# Patient Record
Sex: Male | Born: 1940 | Race: Black or African American | Hispanic: No | Marital: Married
Health system: Southern US, Community
[De-identification: ages and names within clinical notes are randomized; demographics above are authoritative.]

---

## 2005-04-12 ENCOUNTER — Emergency Department: Payer: Self-pay | Admitting: Emergency Medicine

## 2006-01-25 ENCOUNTER — Ambulatory Visit: Payer: Self-pay | Admitting: Gastroenterology

## 2006-08-03 ENCOUNTER — Other Ambulatory Visit: Payer: Self-pay

## 2006-08-03 ENCOUNTER — Inpatient Hospital Stay: Payer: Self-pay | Admitting: Internal Medicine

## 2006-08-07 ENCOUNTER — Other Ambulatory Visit: Payer: Self-pay

## 2006-09-06 ENCOUNTER — Ambulatory Visit: Payer: Self-pay | Admitting: Gastroenterology

## 2006-09-12 ENCOUNTER — Ambulatory Visit: Payer: Self-pay | Admitting: Gastroenterology

## 2007-05-16 ENCOUNTER — Other Ambulatory Visit: Payer: Self-pay

## 2007-05-17 ENCOUNTER — Inpatient Hospital Stay: Payer: Self-pay | Admitting: Psychiatry

## 2007-09-05 ENCOUNTER — Ambulatory Visit: Payer: Self-pay | Admitting: Specialist

## 2008-05-16 ENCOUNTER — Emergency Department: Payer: Self-pay | Admitting: Unknown Physician Specialty

## 2008-09-14 ENCOUNTER — Emergency Department: Payer: Self-pay | Admitting: Unknown Physician Specialty

## 2010-01-05 ENCOUNTER — Inpatient Hospital Stay: Payer: Self-pay | Admitting: Internal Medicine

## 2010-04-28 ENCOUNTER — Ambulatory Visit: Payer: Self-pay | Admitting: Gastroenterology

## 2010-06-02 ENCOUNTER — Ambulatory Visit: Payer: Self-pay | Admitting: Gastroenterology

## 2010-07-14 ENCOUNTER — Ambulatory Visit: Payer: Self-pay | Admitting: Specialist

## 2010-08-26 ENCOUNTER — Ambulatory Visit: Payer: Self-pay | Admitting: Oncology

## 2010-08-27 ENCOUNTER — Ambulatory Visit: Payer: Self-pay

## 2010-08-27 ENCOUNTER — Ambulatory Visit: Payer: Self-pay | Admitting: Oncology

## 2010-08-30 ENCOUNTER — Telehealth: Payer: Self-pay

## 2010-08-30 DIAGNOSIS — R933 Abnormal findings on diagnostic imaging of other parts of digestive tract: Secondary | ICD-10-CM

## 2010-08-30 NOTE — Telephone Encounter (Signed)
Pt scheduled for EUS on 09/16/10 [place] need to review meds and instruct pt.

## 2010-08-30 NOTE — Telephone Encounter (Signed)
I spoke with the wife of the pt and she is aware of the appt and meds were reviewed.  Instructions mailed to the home and pt to call with any questions

## 2010-09-14 ENCOUNTER — Ambulatory Visit: Payer: Self-pay | Admitting: Oncology

## 2010-09-16 ENCOUNTER — Ambulatory Visit: Payer: Self-pay

## 2010-09-16 ENCOUNTER — Encounter: Payer: Self-pay | Admitting: Gastroenterology

## 2010-09-21 ENCOUNTER — Telehealth: Payer: Self-pay | Admitting: Gastroenterology

## 2010-09-21 NOTE — Telephone Encounter (Signed)
I spoke with him and his wife today about the biopsy reports showing "inflamed mucosa with intra-mucosal carcinoma" he is already scheduled to see Dr. Doylene Canning later this week. I will forward results to him and also to his primary gastroenterologist Dr. Niel Hummer.  Patty, and you please make sure that [facility] pathology department since this reports to the above doctors.

## 2010-09-21 NOTE — Telephone Encounter (Signed)
Can you call pathology at [facility], check on the path from his procedure last week. thanks

## 2010-09-21 NOTE — Telephone Encounter (Signed)
Path is being faxed now. I will put results on your desk.

## 2010-09-26 ENCOUNTER — Ambulatory Visit: Payer: Self-pay | Admitting: Oncology

## 2010-10-04 ENCOUNTER — Encounter: Payer: Self-pay | Admitting: Gastroenterology

## 2010-10-27 ENCOUNTER — Ambulatory Visit: Payer: Self-pay | Admitting: Oncology

## 2010-11-27 ENCOUNTER — Ambulatory Visit: Payer: Self-pay | Admitting: Oncology

## 2010-12-27 ENCOUNTER — Ambulatory Visit: Payer: Self-pay | Admitting: Oncology

## 2011-01-27 ENCOUNTER — Ambulatory Visit: Payer: Self-pay | Admitting: Oncology

## 2011-02-09 ENCOUNTER — Ambulatory Visit: Payer: Self-pay | Admitting: Oncology

## 2011-02-22 ENCOUNTER — Inpatient Hospital Stay: Payer: Self-pay | Admitting: Psychiatry

## 2011-02-24 DIAGNOSIS — Z79899 Other long term (current) drug therapy: Secondary | ICD-10-CM

## 2011-02-26 ENCOUNTER — Ambulatory Visit: Payer: Self-pay | Admitting: Oncology

## 2011-04-04 ENCOUNTER — Emergency Department: Payer: Self-pay | Admitting: Emergency Medicine

## 2011-04-04 LAB — SALICYLATE LEVEL: Salicylates, Serum: <1.7 mg/dL

## 2011-04-04 LAB — COMPREHENSIVE METABOLIC PANEL
Alkaline Phosphatase: 73 U/L (ref 50–136)
BUN: 8 mg/dL (ref 7–18)
Bilirubin,Total: 0.3 mg/dL (ref 0.2–1.0)
Chloride: 105 mmol/L (ref 98–107)
Co2: 27 mmol/L (ref 21–32)
Creatinine: 0.95 mg/dL (ref 0.60–1.30)
EGFR (African American): >60
Osmolality: 280 (ref 275–301)
Potassium: 3.1 mmol/L — ABNORMAL LOW (ref 3.5–5.1)
SGOT(AST): 22 U/L (ref 15–37)
SGPT (ALT): 15 U/L
Total Protein: 7.9 g/dL (ref 6.4–8.2)

## 2011-04-04 LAB — DRUG SCREEN, URINE
Amphetamines, Ur Screen: NEGATIVE (ref ?–1000)
Barbiturates, Ur Screen: NEGATIVE (ref ?–200)
Benzodiazepine, Ur Scrn: NEGATIVE (ref ?–200)
Cannabinoid 50 Ng, Ur ~~LOC~~: NEGATIVE (ref ?–50)
MDMA (Ecstasy)Ur Screen: NEGATIVE (ref ?–500)
Methadone, Ur Screen: NEGATIVE (ref ?–300)
Opiate, Ur Screen: NEGATIVE (ref ?–300)
Phencyclidine (PCP) Ur S: NEGATIVE (ref ?–25)

## 2011-04-04 LAB — URINALYSIS, COMPLETE
Bacteria: NONE SEEN
Glucose,UR: NEGATIVE mg/dL (ref 0–75)
Leukocyte Esterase: NEGATIVE
Nitrite: NEGATIVE
Ph: 5 (ref 4.5–8.0)
Protein: NEGATIVE
RBC,UR: <1 /HPF (ref 0–5)
Specific Gravity: 1.005 (ref 1.003–1.030)
WBC UR: 1 /HPF (ref 0–5)

## 2011-04-04 LAB — ETHANOL
Ethanol %: 0.061 % (ref 0.000–0.080)
Ethanol: 61 mg/dL

## 2011-04-04 LAB — CBC
Platelet: 229 10*3/uL (ref 150–440)
RBC: 4.67 10*6/uL (ref 4.40–5.90)
RDW: 15.4 % — ABNORMAL HIGH (ref 11.5–14.5)

## 2011-04-05 LAB — POTASSIUM: Potassium: 3.5 mmol/L (ref 3.5–5.1)

## 2011-05-02 ENCOUNTER — Ambulatory Visit: Payer: Self-pay | Admitting: Oncology

## 2011-05-02 LAB — COMPREHENSIVE METABOLIC PANEL
Alkaline Phosphatase: 84 U/L (ref 50–136)
Anion Gap: 7 (ref 7–16)
Calcium, Total: 8.6 mg/dL (ref 8.5–10.1)
Chloride: 105 mmol/L (ref 98–107)
Co2: 30 mmol/L (ref 21–32)
Creatinine: 1.2 mg/dL (ref 0.60–1.30)
EGFR (Non-African Amer.): >60
Osmolality: 283 (ref 275–301)
Potassium: 3.6 mmol/L (ref 3.5–5.1)
SGOT(AST): 17 U/L (ref 15–37)
Sodium: 142 mmol/L (ref 136–145)

## 2011-05-02 LAB — CBC CANCER CENTER
Basophil #: 0 x10 3/mm (ref 0.0–0.1)
Basophil %: 0.2 %
Eosinophil #: 0.1 x10 3/mm (ref 0.0–0.7)
HGB: 14.3 g/dL (ref 13.0–18.0)
Lymphocyte %: 17 %
MCH: 33.1 pg (ref 26.0–34.0)
MCHC: 34.2 g/dL (ref 32.0–36.0)
Monocyte #: 0.8 x10 3/mm — ABNORMAL HIGH (ref 0.0–0.7)
Monocyte %: 10.6 %
Neutrophil #: 5.6 x10 3/mm (ref 1.4–6.5)
Neutrophil %: 71.1 %
Platelet: 243 x10 3/mm (ref 150–440)
RBC: 4.32 10*6/uL — ABNORMAL LOW (ref 4.40–5.90)
RDW: 15.4 % — ABNORMAL HIGH (ref 11.5–14.5)

## 2011-05-27 ENCOUNTER — Ambulatory Visit: Payer: Self-pay | Admitting: Oncology

## 2011-06-27 ENCOUNTER — Ambulatory Visit: Payer: Self-pay | Admitting: Oncology

## 2011-06-27 ENCOUNTER — Ambulatory Visit: Payer: Self-pay | Admitting: Internal Medicine

## 2011-06-27 LAB — CBC
HCT: 42.4 % (ref 40.0–52.0)
HGB: 14.3 g/dL (ref 13.0–18.0)
MCHC: 33.8 g/dL (ref 32.0–36.0)
MCV: 99 fL (ref 80–100)
RBC: 4.27 10*6/uL — ABNORMAL LOW (ref 4.40–5.90)
WBC: 13.2 10*3/uL — ABNORMAL HIGH (ref 3.8–10.6)

## 2011-06-27 LAB — TROPONIN I: Troponin-I: <0.02 ng/mL

## 2011-06-27 LAB — BASIC METABOLIC PANEL
Anion Gap: 13 (ref 7–16)
BUN: 9 mg/dL (ref 7–18)
Calcium, Total: 8.3 mg/dL — ABNORMAL LOW (ref 8.5–10.1)
Creatinine: 0.95 mg/dL (ref 0.60–1.30)
EGFR (Non-African Amer.): >60
Osmolality: 274 (ref 275–301)

## 2011-06-28 ENCOUNTER — Inpatient Hospital Stay: Payer: Self-pay | Admitting: Specialist

## 2011-06-28 LAB — ETHANOL: Ethanol %: 0.028 % (ref 0.000–0.080)

## 2011-06-28 LAB — TROPONIN I
Troponin-I: <0.02 ng/mL
Troponin-I: <0.02 ng/mL

## 2011-06-28 LAB — CK TOTAL AND CKMB (NOT AT ARMC)
CK, Total: 89 U/L (ref 35–232)
CK, Total: 103 U/L (ref 35–232)
CK-MB: 1.3 ng/mL (ref 0.5–3.6)
CK-MB: 1.7 ng/mL (ref 0.5–3.6)

## 2011-06-29 LAB — COMPREHENSIVE METABOLIC PANEL
Albumin: 2.9 g/dL — ABNORMAL LOW (ref 3.4–5.0)
Alkaline Phosphatase: 57 U/L (ref 50–136)
Anion Gap: 9 (ref 7–16)
Calcium, Total: 8.3 mg/dL — ABNORMAL LOW (ref 8.5–10.1)
Chloride: 105 mmol/L (ref 98–107)
Co2: 26 mmol/L (ref 21–32)
Creatinine: 1.01 mg/dL (ref 0.60–1.30)
EGFR (African American): >60
EGFR (Non-African Amer.): >60
Osmolality: 281 (ref 275–301)
SGOT(AST): 15 U/L (ref 15–37)
SGPT (ALT): 10 U/L — ABNORMAL LOW
Sodium: 140 mmol/L (ref 136–145)
Total Protein: 6.6 g/dL (ref 6.4–8.2)

## 2011-06-29 LAB — CBC WITH DIFFERENTIAL/PLATELET
Basophil #: 0 10*3/uL (ref 0.0–0.1)
Basophil %: 0 %
Eosinophil %: 0 %
HCT: 39.9 % — ABNORMAL LOW (ref 40.0–52.0)
Lymphocyte #: 0.6 10*3/uL — ABNORMAL LOW (ref 1.0–3.6)
MCH: 33 pg (ref 26.0–34.0)
MCHC: 32.9 g/dL (ref 32.0–36.0)
MCV: 100 fL (ref 80–100)
Monocyte #: 1.9 10*3/uL — ABNORMAL HIGH (ref 0.0–0.7)
Neutrophil #: 13.9 10*3/uL — ABNORMAL HIGH (ref 1.4–6.5)
Neutrophil %: 84.8 %
Platelet: 206 10*3/uL (ref 150–440)
RDW: 15.1 % — ABNORMAL HIGH (ref 11.5–14.5)
WBC: 16.4 10*3/uL — ABNORMAL HIGH (ref 3.8–10.6)

## 2011-06-29 LAB — CK TOTAL AND CKMB (NOT AT ARMC): CK, Total: 139 U/L (ref 35–232)

## 2011-07-02 LAB — CULTURE, BLOOD (SINGLE)

## 2011-07-04 LAB — CULTURE, BLOOD (SINGLE)

## 2011-07-27 ENCOUNTER — Ambulatory Visit: Payer: Self-pay | Admitting: Oncology

## 2011-07-27 ENCOUNTER — Ambulatory Visit: Payer: Self-pay | Admitting: Internal Medicine

## 2012-04-09 IMAGING — RF DG BARIUM SWALLOW
12 series · 14 of 24 positions shown · non-contrast
Comparison: none

REASON FOR EXAM: dysphagia
COMMENTS:

[Series 1: fluoro_barium 2fps_bw · 0.17mm/px · 1 of 36 frames shown (1 of 12)]
[frame 6/36]
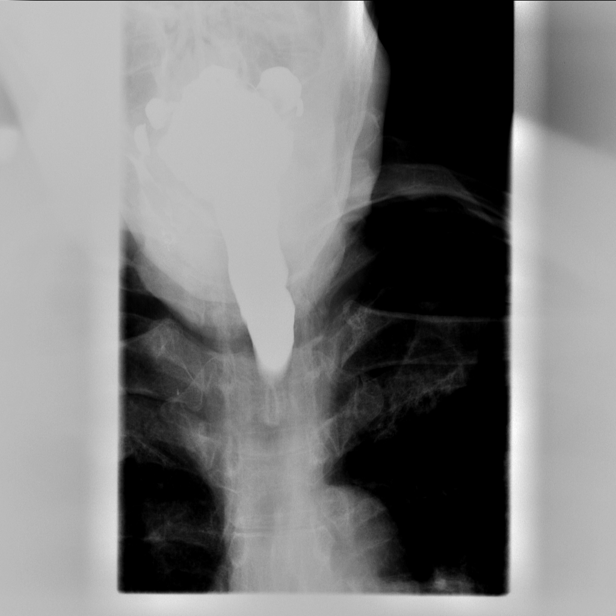

[Series 2: fluoro_barium 2fps_bw · 0.18mm/px · 2 of 40 frames shown (2 of 12)]
[frame 7/40]
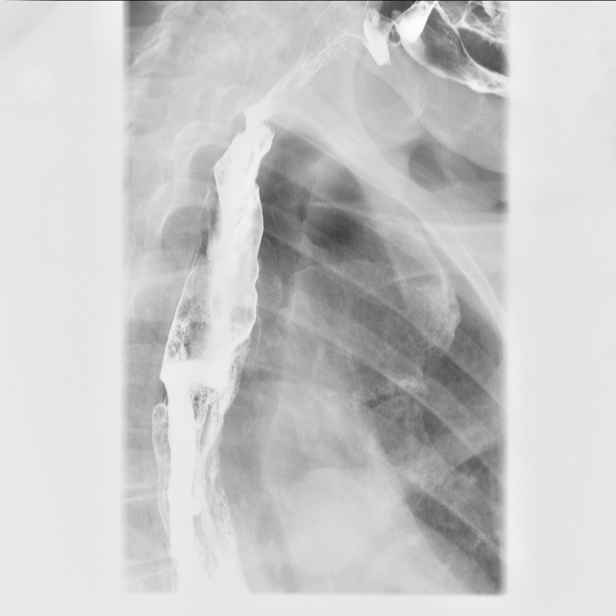
[frame 35/40]
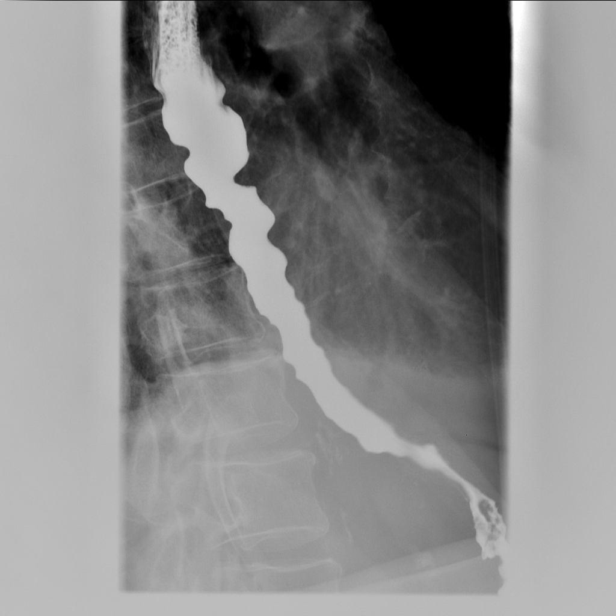

[Series 3: fluoro_barium 2fps_bw · 0.18mm/px · 1 of 32 frames shown (3 of 12)]
[frame 28/32]
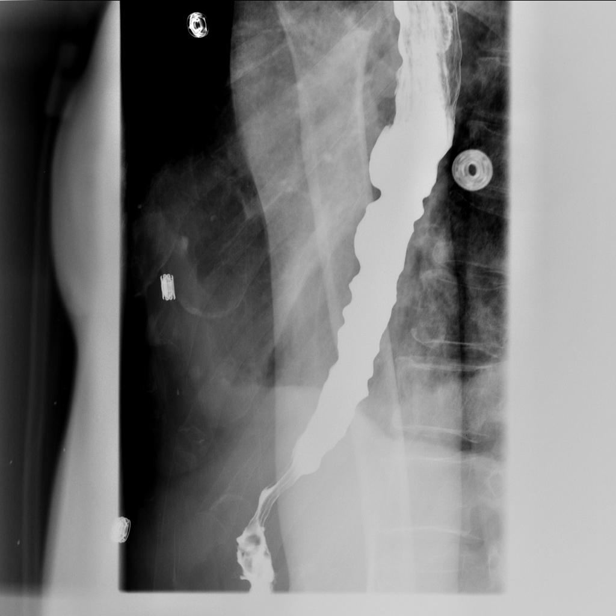

[Series 4: fluoro_barium 2fps_bw · 0.17mm/px · 1 of 28 frames shown (4 of 12)]
[frame 15/28]
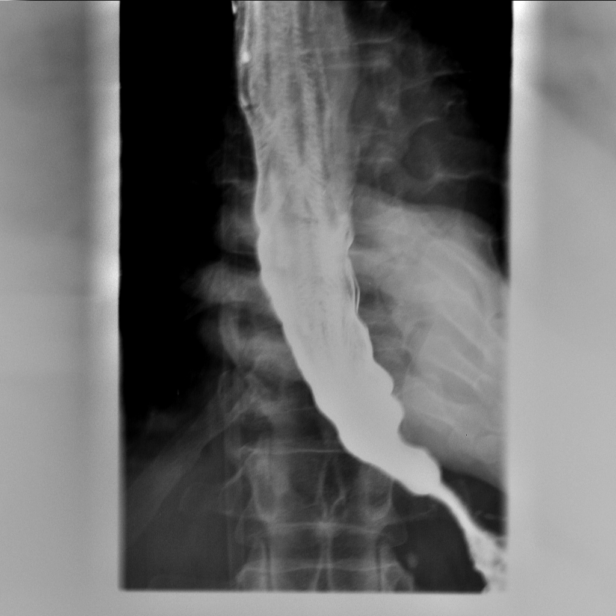

[Series 5: fluoro_barium 2fps_bw · 0.17mm/px · 1 of 23 frames shown (5 of 12)]
[frame 4/23]
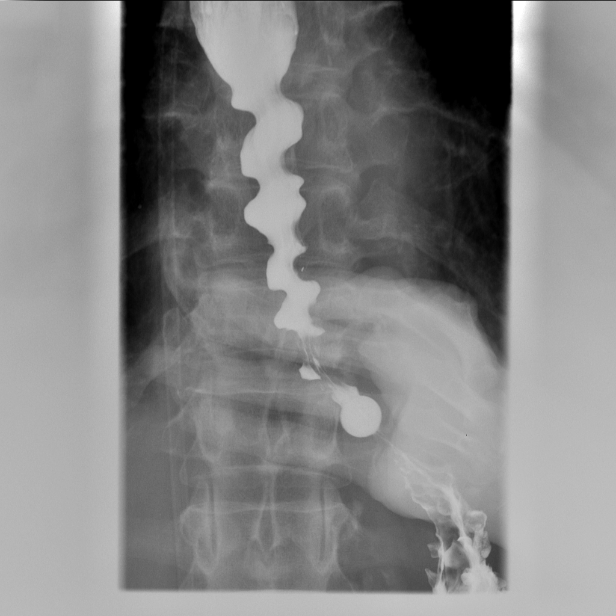

[Series 6: fluoro_barium 2fps_bw · 0.17mm/px · 2 of 22 frames shown (6 of 12)]
[frame 4/22]
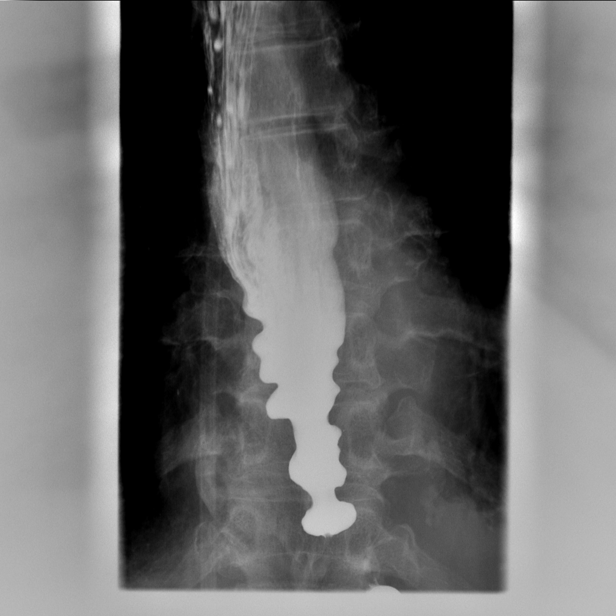
[frame 19/22]
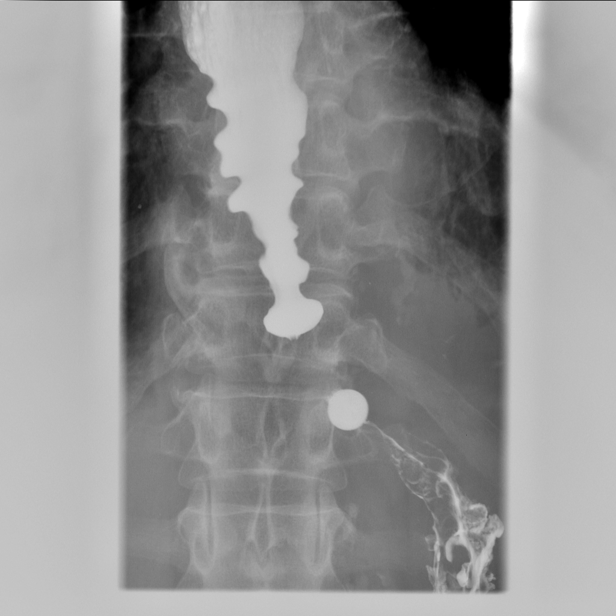

[Series 7: fluoro_barium 2fps_bw · 0.17mm/px · 1 of 25 frames shown (7 of 12)]
[frame 22/25]
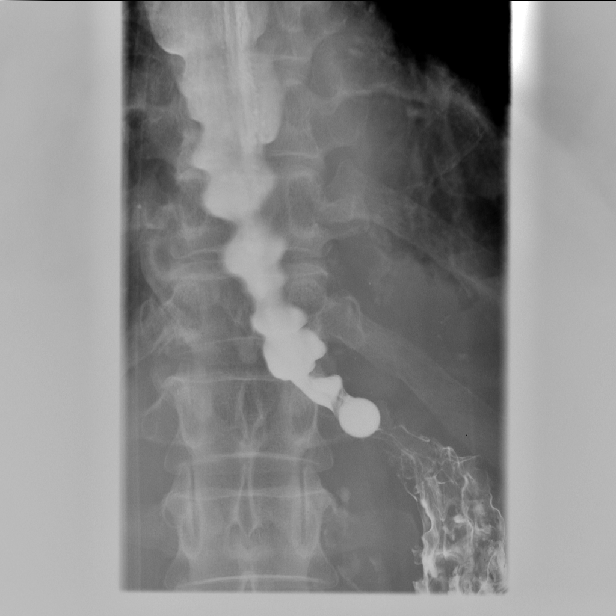

[Series 8: fluoro_barium 2fps_bw · 0.17mm/px · 1 of 15 frames shown (8 of 12)]
[frame 8/15]
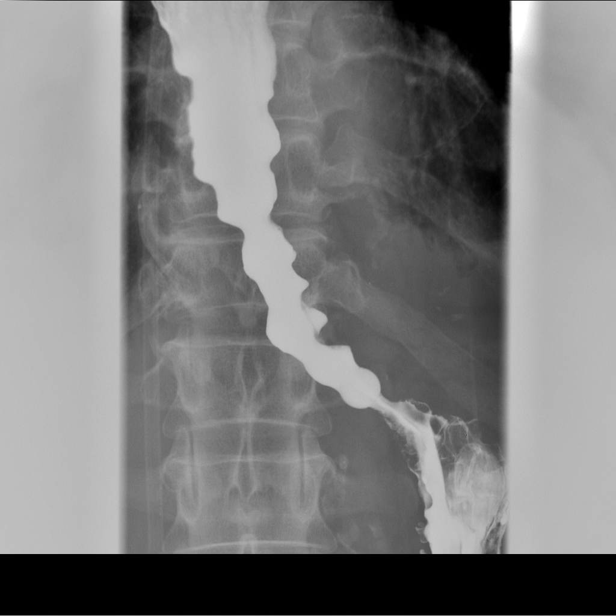

[Series 9: fluoro_barium 2fps_bw · 0.17mm/px · 1 of 40 frames shown (9 of 12)]
[frame 35/40]
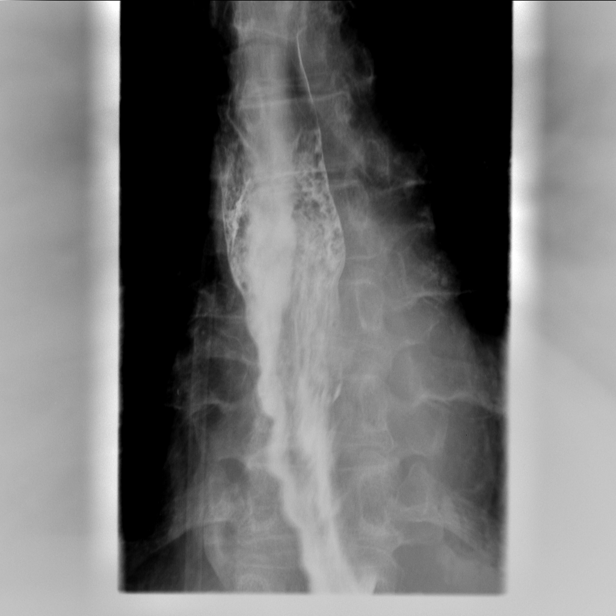

[Series 10: fluoro_barium 2fps_bw · 0.18mm/px · 1 of 2 frames shown (10 of 12)]
[frame 1/2]
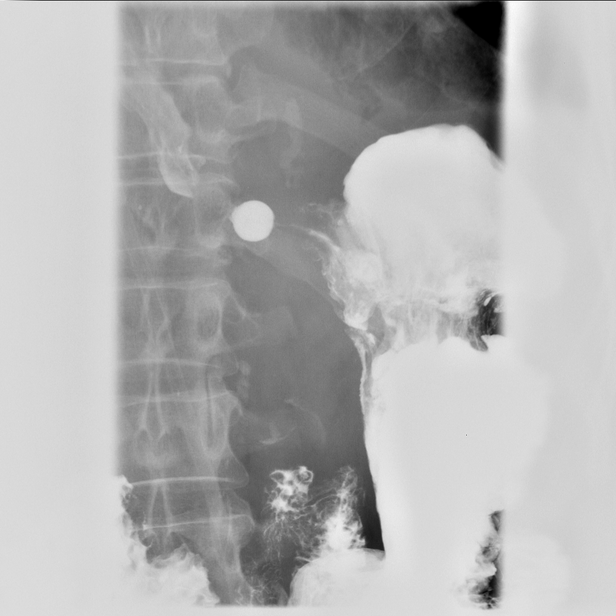

[Series 11: fluoro_barium 2fps_bw · 0.20mm/px · 1 of 4 frames shown (11 of 12)]
[frame 3/4]
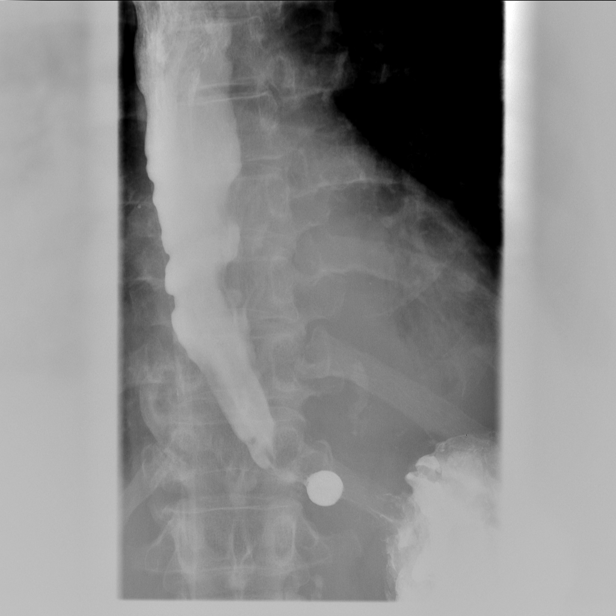

[Series 12: fluoro_barium 2fps_bw · 0.20mm/px · 1 of 5 frames shown (12 of 12)]
[frame 5/5]
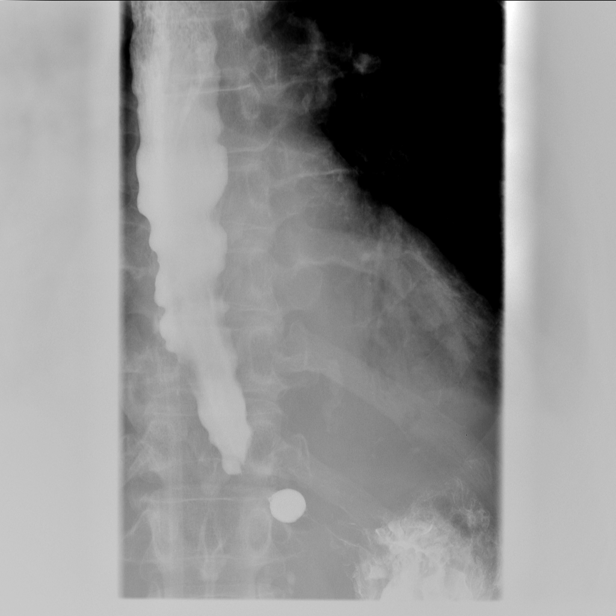

[14 of 24 positions shown; findings below may reference images not displayed]

PROCEDURE:     FL  - FL BARIUM SWALLOW  - July 01, 2011  [DATE]

RESULT:     Barium swallow examination is performed. There is no evidence of
aspiration or penetration of barium into the upper airway. There is
intermittent spasm within the distal esophagus. There is a narrowed area in
the distal esophagus through which a 12.5 mm barium impregnated tablet would
not pass. The tablet did not cause occlusion or obstruction of passage of
liquid through this area. Proximal to this, there is mild dilation within
the midesophagus intermittently. There is very poor peristaltic emptying of
esophageal content.

Within the midesophagus there is mucosal nodularity. Microulcers could not
be excluded. Esophagitis could give this appearance as could opportunistic
infectious etiologies. Endoscopic correlation may be beneficial.
IMPRESSION: No complete obstruction. There is an area of significant narrowing within
the distal esophagus through which liquids passed but which did not allow
passage of a 12.5 mm barium impregnated tablet. There is significant
tertiary spasm within the distal third to half of the esophagus. Proximal to
this, there is dilation with thickening of the mucosa with some nodular
appearance. Some of this could be due to residual ingested material. The
possibility of esophagitis should be considered and correlated clinically.
Endoscopic followup may be beneficial.

## 2014-07-20 NOTE — Discharge Summary (Signed)
PATIENT NAME:  Dillon Morales, Dillon Morales MR#:  [id number] DATE OF BIRTH:  03-Jun-1940  DATE OF ADMISSION:  06/28/2011 DATE OF DISCHARGE:  07/01/2011  For a detailed note, please take a look at the history and physical done on admission by Dr. Deneen Harts.   DISCHARGE DIAGNOSES: 1. Pneumonia, likely suspected aspiration, much improved.  2. Esophageal cancer status post radiation therapy.  3. Dysphagia due to esophageal cancer. 4. Chest pain secondary to esophageal cancer. 5. Psychosis.  6. Alcohol abuse. 7. Tobacco abuse.   DIET: The patient is being discharged on a mechanical soft diet.   ACTIVITY: As tolerated.  REFERRALS:  The patient is being referred for home health psychiatric nursing and also is going to be followed by home hospice.   DISCHARGE FOLLOWUP: Followup with Dr. Johney Maine in the next 1 to 2 weeks.   DISCHARGE MEDICATIONS:  1. Omeprazole 20 mg daily.  2. Risperidone 2 mg daily.  3. Trazodone 100 mg daily.  4. Levaquin 750 mg daily x7 days. 5. Lisinopril 10 mg daily.   CONSULTANTS:  Johney Maine, MD - Oncology.   PERTINENT STUDIES: Chest x-ray done on admission showed shallow inspiration, pulmonary vascular congestion.   Barium swallow done on 07/01/2011 showed no complete obstruction. Area of significant narrowing within the distal esophagus through which liquids passed, but did not allow passage of a 12.5 mm barium impregnated tablet. Significant tertiary spasm within the distal third to half of the esophagus. This could all be related to esophagitis. Endoscopic follow-up may be beneficial.   HOSPITAL COURSE: This is a 74 year old male with medical problems as mentioned above who presented to the hospital on 06/28/2011 secondary to shortness of breath and chest pain suspected from possible pneumonia.  1. Pneumonia: This was likely aspiration pneumonia given his esophageal cancer and dysphagia. The patient was initially empirically started on vancomycin and Zosyn. His antibiotics  were then narrowed to just Levaquin. He has been maintained on Levaquin and done well. He has been afebrile. His blood cultures and sputum cultures have been negative. His blood cultures actually grew out a gram-positive rod, which was a skin contaminant. His respiratory symptoms are significantly improved. Given the fact that he is being discharged to home with hospice services, he is being discharged on some oxygen. As mentioned, the patient did have a barium swallow done which showed no complete obstruction; therefore, he is going to be maintained on a mechanical soft diet for now.  2. Esophageal cancer: The patient is followed by Dr. Doylene Canning. He has had radiation therapy in the past. The patient's prognosis is poor, as he continues to drink and smoke. As mentioned, the barium swallow did not show any complete obstruction; therefore, he will be maintained on a mechanical soft diet. The wife is not interested in any aggressive treatment, but just some comfort care for now. Therefore, there was no Gastroenterology consult placed for endoscopic evaluation. 3. Chest pain: This was most likely chest pain related to esophageal cancer. His cardiac enzymes were cycled and were negative. Currently he is chest pain free. 4. Hypertension: The patient remained hemodynamically stable on some lisinopril. He will be discharged on that.  5. History of alcohol abuse: He was placed on CIWA protocol and did not show any evidence of alcohol withdrawal, and he was strongly advised to abstain from drinking when he goes home.  6. Psychosis: The patient is being maintained on his risperidone, which he will resume upon discharge along with his trazodone. He does have home health  psychiatric service nursing services, which he will continue upon discharge.      CODE STATUS: The patient is a DO NOT INTUBATE, DO NOT RESUSCITATE. He is being discharged home with hospice services. The patient was also seen by Dr. Ned GraceNancy Phifer, from  palliative care, who referred him for hospice services at home.   TIME SPENT: 40 minutes. ____________________________ Rolly PancakeVivek J. Cherlynn KaiserSainani, MD vjs:slb D: 07/01/2011 14:28:57 ET T: 07/02/2011 14:43:53 ET JOB#: [id number]  cc: Rolly PancakeVivek J. Cherlynn KaiserSainani, MD, <Dictator> Gerome SamJanak K. Doylene Canninghoksi, MD Houston SirenVIVEK J SAINANI MD ELECTRONICALLY SIGNED 07/04/2011 14:56

## 2014-07-20 NOTE — Consult Note (Signed)
History of Present Illness:   Reason for Consult History of carcinoma of esophagus had radiation therapy in the past  Failure to thrive  Persistent nausea vomiting  continuing tobacco and alcohol abuse    HPI   June 29, 2011 Patient was admitted in the hospital with progressive declining condition.  Increasing nausea vomiting.  Patient this morning extremely confused and disoriented trying to pull  IV   out. .  Also was found to have aspiration pneumonia.had multiple previous hospitalization.  Multiple biopsies.  Radiation therapy.  Her carcinoma of esophagus.  Also had a hospitalization in psychiatric hospital.  PFSH:   Family History No family history of colorectal cancer, breast cancer, or ovarian cancer.    Social History positive alcohol, positive tobacco    Additional Past Medical and Surgical History As described above   Review of Systems:   General weakness  fatigue    Performance Status (ECOG) 3    HEENT dysphagia    Lungs cough  SOB    Cardiac no complaints    GI nausea/vomiting  pain    GU no complaints    Musculoskeletal back pain    Extremities swelling    Skin no complaints    Neuro Getting confused and disoriented   NURSING NOTES: **Vital Signs.:   03-Apr-13 04:52    Vital Signs Type: Q 4hr    Temperature Temperature (F): 97.6    Celsius: 36.4    Temperature Source: oral    Pulse Pulse: 104    Pulse source: per Dinamap    Respirations Respirations: 22    Systolic BP Systolic BP: 474    Diastolic BP (mmHg) Diastolic BP (mmHg): 80    Mean BP: 100    BP Source: Dinamap    Pulse Ox % Pulse Ox %: 91    Pulse Ox Activity Level: At rest    Oxygen Delivery: 3L   Physical Exam:   General Patient's condition is extremely poor with poor performance status.    HEENT: Poor dental hygiene    Lungs: rales  crepitations    Cardiac: regular rate, rhythm    Abdomen: soft  nontender  positive bowel sounds    Skin: intact     Extremities: edema  1+    Physical Exam Patient is somewhat confused disoriented tremor as no other focal sign     Pneumonia: 28-Jun-2011   Lung cancer with radiation therapy:    asthma:    Cirrhosis:    Alcohol Abuse:    Denies surgical history.:    Denies:    NKDA: None    omeprazole 20 mg delayed release capsule: 1 cap(s) orally once a day , Active, 30, 2   risperidone 2 mg oral tablet: 1 tab(s) orally once a day (at bedtime), Active, 0, None   trazodone 100 mg oral tablet: tab(s) orally once a day (at bedtime), Active, 0, None  Routine Hem:  03-Apr-13 03:47    WBC (CBC) 16.4   RBC (CBC) 3.98   Hemoglobin (CBC) 13.1   Hematocrit (CBC) 39.9   Platelet Count (CBC) 206   MCV 100   MCH 33.0   MCHC 32.9   RDW 15.1  Routine Chem:  03-Apr-13 03:47    Glucose, Serum 101   BUN 16   Creatinine (comp) 1.01   Sodium, Serum 140   Potassium, Serum 3.8   Chloride, Serum 105   CO2, Serum 26   Calcium (Total), Serum 8.3   Anion Gap 9  Osmolality (calc) 281   eGFR (African American) >60   eGFR (Non-African American) >60  Routine Hem:  03-Apr-13 03:47    Neutrophil % 84.8   Lymphocyte % 3.4   Monocyte % 11.8   Eosinophil % 0.0   Basophil % 0.0   Neutrophil # 13.9   Lymphocyte # 0.6   Monocyte # 1.9   Eosinophil # 0.0   Basophil # 0.0  Hepatic:  03-Apr-13 03:47    Bilirubin, Total 0.6   Alkaline Phosphatase 57   SGPT (ALT) 10   SGOT (AST) 15   Total Protein, Serum 6.6   Albumin, Serum 2.9     01-Apr-13 22:42, Chest Portable Single View   Assessment and Plan:  Impression:   1.possible aspiration pneumonia Leukocytosis Failure to thrive History of carcinoma of esophagus reassessment has not been done status post radiation therapy Continuing tobacco and alcohol abuse. Alcohol withdrawal cannot be ruled out Overall poor prognosis Family agreeable for hospice care Agreeable for limited code did not want any ventilation.  On any aggressive  measures Possiblebarium swallow  if patients general condition improves     Electronic Signatures: Arlet Marter, Martie Lee (MD)  (Signed 03-Apr-13 12:59)  Authored: HISTORY OF PRESENT ILLNESS, PFSH, ROS, NURSING NOTES, PE, PAST MEDICAL HISTORY, ALLERGIES, HOME MEDICATIONS, LABS, OTHER RESULTS, ASSESSMENT AND PLAN   Last Updated: 03-Apr-13 12:59 by Jobe Gibbon (MD)

## 2014-07-20 NOTE — H&P (Signed)
PATIENT NAME:  Dillon Morales, Dillon Morales MR#:  [id number] DATE OF BIRTH:  02/04/1941  DATE OF ADMISSION:  06/28/2011  CHIEF COMPLAINT: Chest pain and shortness of breath.  HISTORY OF PRESENT ILLNESS: This is a 74 year old pleasant male with past medical history of esophageal cancer, alcohol abuse, cirrhosis, asthma, and psychosis who comes to the Emergency Room today with noted increasing shortness of breath. The patient states he's had a fever since last night with cough productive of white clear sputum. The patient states in the interim he has also retrosternal chest pain characterized as dull in nature with no relieving or exacerbating factors and nonradiating. Today the patient is escorted by his spouse who also endorses that he has not been feeling well these past few days with decrease in p.o. intake. Presently Mr. Cajuste spouse also endorses that he continues to smoke and drink beer regardless of his noted history of esophageal cancer and cirrhosis secondary to alcohol abuse and states that it's not whiskey but beer. While here in the Emergency Room, the patient was noted to have an elevated blood pressure, which is new for him, as well as elevated WBC and noted infiltrate on chest x-ray and subsequently treated with Levaquin while in the Emergency Room. It is also noted that the patient was hypoxic on room air and supplemented with nasal cannula oxygen with improvement of oxygen saturation.  ALLERGIES: No known drug allergies.  MEDICATIONS: 1. Trazodone 100 at bedtime. 2. Risperidone 2 mg p.o. at bedtime.  PAST MEDICAL HISTORY: 1. Esophageal cancer with chemoradiation per spouse with date unknown. 2. Alcohol abuse. 3. Cirrhosis. 4. Asthma. 5. Psychosis.  PRIMARY DOCTOR: Maryruth Hancock, MD. The patient also follows with Hematology/Oncology. The patient and spouse cannot provide the name at this time.   PAST SURGICAL HISTORY: None.  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 Denies any other illicit drugs.   FAMILY HISTORY: Per prior history and physical admission, positive for cancer in his brothers and sister with throat cancer as well.  REVIEW OF SYSTEMS: CONSTITUTIONAL: Positive for fatigue and weight loss. EYES: No blurred vision. No pain. No redness. ENT: No ear pain, tinnitus, hearing loss, rhinorrhea, or sore throat. RESPIRATORY: Positive for cough with whitish sputum, shortness of breath but no wheezes reported. CARDIOVASCULAR: Positive for chest pain, noted retrosternal, nonradiating, described as dull in nature with no alleviating or exacerbating factors noted. GASTROINTESTINAL: No nausea, vomiting, diarrhea, or abdominal pain. GU: No dysuria or hematuria noted. ENDOCRINE: No polyuria or polydipsia. HEME/LYMPH: No easy bruising or active bleeding reported. SKIN: No rashes or active lesions reported. MUSCULOSKELETAL: No neck pain, back pain, knee pain, shoulder pain, or hip pain reported. NEUROLOGIC: No numbness or sensory deficits reported but does state has had some gradual weakness. PSYCH: Denies any suicidal or homicidal ideation.   PHYSICAL EXAMINATION:   VITAL SIGNS: Blood pressure 146/64, respirations 24, pulse 102, temperature 97.2, pulse oximetry presently 91% on room air.  GENERAL APPEARANCE: Thin, cachectic African American male lying on the stretcher, comfortable, responsive in no acute distress.  HEENT: Pupils are equal, round, and reactive to light and accommodation. Extraocular movements are intact. No scleral icterus appreciated. No conjunctivitis. He has very poor dentition with mucous membranes moist with no oropharynx obstruction observed.   NECK: Supple with no thyromegaly appreciated. No adenopathy noted.   RESPIRATORY: Good air entry bilaterally. Positive for rales in  right lower base but no wheezes  appreciated.  CARDIOVASCULAR: S1, S2 present. Regular rate and rhythm with no murmurs noted.   ABDOMEN: Soft, nontender, nondistended. Bowel sounds positive with no organomegaly.   MUSCULOSKELETAL: No apparent joint deformity or swelling noted with full range of motion.  SKIN: No rashes or lesions appreciated.   NEUROLOGIC: Motor and sensory grossly intact with no dysarthria, aphasia, or dysphagia appreciated.   PSYCH: He is awake and responsive, oriented to time and place, is cooperative.   LABORATORY DATA: Troponin negative. WBC elevated at 13.2, hemoglobin 14.3, hematocrit 42.4, platelets 234, sodium 137, potassium 3.1, chloride 99, bicarb 25, BUN 9, creatinine 0.95, glucose 126, calcium 8.3.   Chest x-ray suggestive of right lower lobe infiltrate by ER physician read. pO2 noted 89% on room air initially but increased to 91%. EKG with no ST-T changes but did show sinus tachycardia, heart rate 104.  ASSESSMENT AND PLAN: 1. Pneumonia. Will continue Levaquin IV. Follow-up blood cultures. Sputum cultures sent. Oxygen supplement via nasal cannula.  2. Chest pain. Will cycle cardiac enzymes x3 with telemetry ordered.  3. Hypokalemia. Will supplement potassium with repeat Chem-7 in the morning. 4. History of asthma. Will provide nebulizers p.r.n. for shortness of breath, however, is stable presently with no bronchodilator requirement at this time. 5. Hypertension. This is new for the patient with noted systolic blood pressure over 140 while in the Emergency Room. It was also noted that the patient had a systolic blood pressure of 180. Will initiate at this point low dose lisinopril as ordered with monitoring of blood pressures to either titrate blood pressure meds as needed or discontinue if blood pressure becomes normotensive or if cannot tolerate and becomes too low.      6. History of esophageal cancer, followed by local Hematology/Oncology. Counseled on smoking cessation. Swallow  evaluation also ordered. As per history of present illness, the patient has decreased p.o. intake with weight loss as well.   ____________________________ Lazaro ArmsMichael C. Virgle Arth, MD mcv:[id number] D: 06/28/2011 06:35:11 ET T: 06/28/2011 09:00:23 ET JOB#: [id number]  cc: Lazaro ArmsMichael C. Sakiyah Shur, MD, <Dictator> Sarah "Sallie" Allena KatzPatel, MD Lazaro ArmsMICHAEL C Hamsini Verrilli MD ELECTRONICALLY SIGNED 07/28/2011 21:56

## 2014-07-20 NOTE — Consult Note (Signed)
Brief Consult Note: Diagnosis: Cognitive disorder NOS, Psychosis NOS.   Recommend further assessment or treatment.   Comments: Dr. Jeanie SewerWilliford is the primary psychiatrist of Mr. Dillon Morales and will see him in consultation.  Electronic Signatures: Kristine LineaPucilowska, Kaylan Friedmann (MD)  (Signed 07-Jan-13 18:59)  Authored: Brief Consult Note   Last Updated: 07-Jan-13 18:59 by Kristine LineaPucilowska, Danial Hlavac (MD)

## 2014-11-27 DEATH — deceased
